# Patient Record
Sex: Male | Born: 1995 | Race: Black or African American | Hispanic: No | Marital: Single | Smoking: Never smoker
Health system: Southern US, Community
[De-identification: ages and names within clinical notes are randomized; demographics above are authoritative.]

## PROBLEM LIST (undated history)

## (undated) DIAGNOSIS — B2790 Infectious mononucleosis, unspecified without complication: Secondary | ICD-10-CM

## (undated) DIAGNOSIS — R635 Abnormal weight gain: Secondary | ICD-10-CM

## (undated) DIAGNOSIS — L709 Acne, unspecified: Secondary | ICD-10-CM

## (undated) DIAGNOSIS — E559 Vitamin D deficiency, unspecified: Secondary | ICD-10-CM

## (undated) HISTORY — DX: Vitamin D deficiency, unspecified: E55.9

## (undated) HISTORY — PX: WISDOM TOOTH EXTRACTION: SHX21

## (undated) HISTORY — DX: Infectious mononucleosis, unspecified without complication: B27.90

## (undated) HISTORY — DX: Abnormal weight gain: R63.5

## (undated) HISTORY — DX: Acne, unspecified: L70.9

---
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
## 2017-03-16 ENCOUNTER — Emergency Department (HOSPITAL_BASED_OUTPATIENT_CLINIC_OR_DEPARTMENT_OTHER)
Admission: EM | Admit: 2017-03-16 | Discharge: 2017-03-16 | Disposition: A | Discharge status: Home / Self Care | Payer: Medicaid Other | Attending: Emergency Medicine | Admitting: Emergency Medicine

## 2017-03-16 ENCOUNTER — Other Ambulatory Visit: Payer: Self-pay

## 2017-03-16 ENCOUNTER — Encounter (HOSPITAL_BASED_OUTPATIENT_CLINIC_OR_DEPARTMENT_OTHER): Payer: Self-pay | Admitting: Emergency Medicine

## 2017-03-16 ENCOUNTER — Emergency Department (HOSPITAL_BASED_OUTPATIENT_CLINIC_OR_DEPARTMENT_OTHER): Payer: Medicaid Other

## 2017-03-16 DIAGNOSIS — J069 Acute upper respiratory infection, unspecified: Secondary | ICD-10-CM | POA: Insufficient documentation

## 2017-03-16 DIAGNOSIS — R05 Cough: Secondary | ICD-10-CM | POA: Diagnosis present

## 2017-03-16 DIAGNOSIS — R531 Weakness: Secondary | ICD-10-CM | POA: Insufficient documentation

## 2017-03-16 NOTE — ED Provider Notes (Signed)
[facility] EMERGENCY DEPARTMENT Provider Note   CSN: [id number] Arrival date & time: 03/16/17  1636     History   Chief Complaint Chief Complaint  Patient presents with  . Cough    HPI Daniel Mccarthy is a 21 y.o. male.  HPI Patient has had a cough over the last month.  Generalized weakness also.  Slight sputum production.  States it feels as if he is just congested.  No fevers.  No sick contacts.  No fevers or chills.  Slight dull chest pain at times but no pain now.  Does not smoke.  He is otherwise healthy.  History of allergies also. History reviewed. No pertinent past medical history.  There are no active problems to display for this patient.   Past Surgical History:  Procedure Laterality Date  . WISDOM TOOTH EXTRACTION         Home Medications    Prior to Admission medications   Not on File    Family History History reviewed. No pertinent family history.  Social History Social History   Tobacco Use  . Smoking status: Never Smoker  . Smokeless tobacco: Never Used  Substance Use Topics  . Alcohol use: No  . Drug use: No     Allergies   Patient has no known allergies.   Review of Systems Review of Systems  Constitutional: Negative for activity change and appetite change.  HENT: Positive for congestion.   Eyes: Negative for pain.  Respiratory: Positive for cough. Negative for chest tightness and shortness of breath.   Cardiovascular: Negative for chest pain and leg swelling.  Gastrointestinal: Negative for abdominal pain, diarrhea, nausea and vomiting.  Genitourinary: Negative for flank pain.  Musculoskeletal: Negative for back pain.  Skin: Negative for rash.  Neurological: Negative for weakness.  Psychiatric/Behavioral: Negative for behavioral problems.     Physical Exam Updated Vital Signs BP 121/67 (BP Location: Left Arm)   Pulse 63   Temp 98.4 F (36.9 C) (Oral)   Resp 18   Ht 5\' 10"  (1.778 m)   Wt 79.4 kg (175 lb)    SpO2 100%   BMI 25.11 kg/m   Physical Exam  Constitutional: He appears well-developed.  HENT:  Head: Atraumatic.  Eyes: EOM are normal.  Neck: Neck supple.  Cardiovascular: Normal rate.  Pulmonary/Chest: Effort normal. He has no wheezes.  Abdominal: There is no tenderness.  Musculoskeletal: He exhibits no edema.  Neurological: He is alert.  Skin: Capillary refill takes less than 2 seconds.  Psychiatric: He has a normal mood and affect.     ED Treatments / Results  Labs (all labs ordered are listed, but only abnormal results are displayed) Labs Reviewed - No data to display  EKG  EKG Interpretation None       Radiology Dg Chest 2 View  Result Date: 03/16/2017 CLINICAL DATA:  Cough and chest congestion for 1 month. EXAM: CHEST  2 VIEW COMPARISON:  None. FINDINGS: The heart size and mediastinal contours are within normal limits. Both lungs are clear. Mild thoracic dextroscoliosis noted. IMPRESSION: No active cardiopulmonary disease.  Mild scoliosis. Electronically Signed   By: Myles RosenthalJohn  Stahl M.D.   On: 03/16/2017 17:18    Procedures Procedures (including critical care time)  Medications Ordered in ED Medications - No data to display   Initial Impression / Assessment and Plan / ED Course  I have reviewed the triage vital signs and the nursing notes.  Pertinent labs & imaging results that were available  during my care of the patient were reviewed by me and considered in my medical decision making (see chart for details).     Patient with cough for the last month.  Benign exam.  Lungs are clear.  Normal x-ray.  Posterior pharynx normal.  Discharge home.  Final Clinical Impressions(s) / ED Diagnoses   Final diagnoses:  Upper respiratory tract infection, unspecified type    ED Discharge Orders    None       Benjiman CorePickering, Amadou Katzenstein, MD 03/16/17 2101

## 2017-03-16 NOTE — ED Triage Notes (Signed)
Patient states that he has had a cough x 1 month  - patient also reports that today he started to have generalized weakness to his bilateral legs

## 2017-03-16 NOTE — ED Notes (Signed)
ED Provider at bedside. 

## 2018-02-17 ENCOUNTER — Emergency Department (HOSPITAL_BASED_OUTPATIENT_CLINIC_OR_DEPARTMENT_OTHER)
Admission: EM | Admit: 2018-02-17 | Discharge: 2018-02-17 | Disposition: A | Discharge status: Home / Self Care | Payer: Medicaid Other | Attending: Emergency Medicine | Admitting: Emergency Medicine

## 2018-02-17 ENCOUNTER — Encounter (HOSPITAL_BASED_OUTPATIENT_CLINIC_OR_DEPARTMENT_OTHER): Payer: Self-pay | Admitting: *Deleted

## 2018-02-17 ENCOUNTER — Other Ambulatory Visit: Payer: Self-pay

## 2018-02-17 DIAGNOSIS — M546 Pain in thoracic spine: Secondary | ICD-10-CM | POA: Insufficient documentation

## 2018-02-17 DIAGNOSIS — R55 Syncope and collapse: Secondary | ICD-10-CM | POA: Insufficient documentation

## 2018-02-17 NOTE — Discharge Instructions (Signed)
I did not see anything concerning on your ekg.  Please call your family doc.  Return for repeat episode.

## 2018-02-17 NOTE — ED Provider Notes (Signed)
[facility] EMERGENCY DEPARTMENT Provider Note   CSN: [id number] Arrival date & time: 02/17/18  2130     History   Chief Complaint Chief Complaint  Patient presents with  . Syncope    HPI Daniel Mccarthy is a 22 y.o. male.  22 yo M with a chief complaint of a syncopal event.  This actually happened 4 days ago.  The patient was at a friend's birthday party he started to feel bad and walked away and then collapsed to the ground.  He woke up and felt better and drove himself home.  He was talking to his mom today and was mentioning that he had some back pain after he fell and then disclosed that he had had a syncopal event.  She then brought him immediately to the emergency department.  He denies chest pain or shortness of breath denies unilateral lower extremity edema.  He denies hemoptysis.  Has a history of syncopal events upon blood draw.  He is having some mild mid thoracic back pain that is worse in certain positions.  Denies other injury from the fall.  The history is provided by the patient and a parent.  Loss of Consciousness   This is a new problem. The current episode started more than 2 days ago. The problem occurs rarely. The problem has been resolved. He lost consciousness for a period of less than one minute. The problem is associated with normal activity. Pertinent negatives include abdominal pain, chest pain, confusion, congestion, fever, headaches, palpitations and vomiting. He has tried relaxation for the symptoms. The treatment provided significant relief. His past medical history does not include DM or seizures.    History reviewed. No pertinent past medical history.  There are no active problems to display for this patient.   Past Surgical History:  Procedure Laterality Date  . WISDOM TOOTH EXTRACTION          Home Medications    Prior to Admission medications   Not on File    Family History History reviewed. No pertinent family  history.  Social History Social History   Tobacco Use  . Smoking status: Never Smoker  . Smokeless tobacco: Never Used  Substance Use Topics  . Alcohol use: No  . Drug use: No     Allergies   Patient has no known allergies.   Review of Systems Review of Systems  Constitutional: Negative for chills and fever.  HENT: Negative for congestion and facial swelling.   Eyes: Negative for discharge and visual disturbance.  Respiratory: Negative for shortness of breath.   Cardiovascular: Positive for syncope. Negative for chest pain and palpitations.  Gastrointestinal: Negative for abdominal pain, diarrhea and vomiting.  Musculoskeletal: Negative for arthralgias and myalgias.  Skin: Negative for color change and rash.  Neurological: Positive for syncope. Negative for tremors and headaches.  Psychiatric/Behavioral: Negative for confusion and dysphoric mood.     Physical Exam Updated Vital Signs BP 128/79 (BP Location: Right Arm)   Pulse 80   Temp 98 F (36.7 C) (Oral)   Resp 16   Ht 5\' 10"  (1.778 m)   SpO2 99%   BMI 25.11 kg/m   Physical Exam  Constitutional: He is oriented to person, place, and time. He appears well-developed and well-nourished.  HENT:  Head: Normocephalic and atraumatic.  Eyes: Pupils are equal, round, and reactive to light. EOM are normal.  Neck: Normal range of motion. Neck supple. No JVD present.  Cardiovascular: Normal rate and regular rhythm.  Exam reveals no gallop and no friction rub.  No murmur heard. Pulmonary/Chest: Breath sounds normal. No respiratory distress. He has no wheezes.  Abdominal: He exhibits no distension and no mass. There is no tenderness. There is no rebound and no guarding.  Musculoskeletal: Normal range of motion. He exhibits no edema.  Neurological: He is alert and oriented to person, place, and time.  Skin: No rash noted. No pallor.  Psychiatric: He has a normal mood and affect. His behavior is normal.  Nursing note and  vitals reviewed.    ED Treatments / Results  Labs (all labs ordered are listed, but only abnormal results are displayed) Labs Reviewed - No data to display  EKG EKG Interpretation  Date/Time:  Tuesday February 17 2018 09:33:34 EST Ventricular Rate:  64 PR Interval:    QRS Duration: 94 QT Interval:  400 QTC Calculation: 413 R Axis:   98 Text Interpretation:  Sinus rhythm Atrial premature complex Consider right ventricular hypertrophy LVH by voltage ST elev, probable normal early repol pattern no wpw, brugada prolonged qt No old tracing to compare Confirmed by Melene Plan [phone]) on 02/17/2018 9:43:53 AM   Radiology No results found.  Procedures Procedures (including critical care time)  Medications Ordered in ED Medications - No data to display   Initial Impression / Assessment and Plan / ED Course  I have reviewed the triage vital signs and the nursing notes.  Pertinent labs & imaging results that were available during my care of the patient were reviewed by me and considered in my medical decision making (see chart for details).     22 yo M with a chief complaint of a syncopal event.  Sounds vasovagal by history.  This actually happened 4 days ago.  ECG without concerning findings.  PCP follow-up.  9:55 AM:  I have discussed the diagnosis/risks/treatment options with the patient and family and believe the pt to be eligible for discharge home to follow-up with PCP. We also discussed returning to the ED immediately if new or worsening sx occur. We discussed the sx which are most concerning (e.g., sudden worsening pain, fever, inability to tolerate by mouth) that necessitate immediate return. Medications administered to the patient during their visit and any new prescriptions provided to the patient are listed below.  Medications given during this visit Medications - No data to display    The patient appears reasonably screen and/or stabilized for discharge and I doubt any  other medical condition or other [facility] requiring further screening, evaluation, or treatment in the ED at this time prior to discharge.    Final Clinical Impressions(s) / ED Diagnoses   Final diagnoses:  Vasovagal syncope  Acute bilateral thoracic back pain    ED Discharge Orders    None       Melene Plan, DO 02/17/18 2536

## 2018-02-17 NOTE — ED Triage Notes (Addendum)
Passed out last Saturday at friend's house during a party.

## 2018-03-05 ENCOUNTER — Telehealth: Payer: Self-pay

## 2018-03-05 NOTE — Telephone Encounter (Signed)
Sent referral to scheduling and filed notes 

## 2018-05-13 ENCOUNTER — Encounter: Payer: Self-pay | Admitting: Interventional Cardiology

## 2018-05-14 ENCOUNTER — Ambulatory Visit: Payer: Medicaid Other | Admitting: Interventional Cardiology

## 2018-05-14 ENCOUNTER — Encounter: Payer: Self-pay | Admitting: Interventional Cardiology

## 2018-05-14 VITALS — BP 110/82 | HR 72 | Ht 70.0 in | Wt 159.8 lb

## 2018-05-14 DIAGNOSIS — R55 Syncope and collapse: Secondary | ICD-10-CM

## 2018-05-14 NOTE — Progress Notes (Signed)
Cardiology Office Note   Date:  05/14/2018   ID:  Daniel Meadatrick A Caniglia, DOB Mar 08, 1996, MRN [id number]  PCP:  [facility]    No chief complaint on file.  syncope  Wt Readings from Last 3 Encounters:  05/14/18 159 lb 12.8 oz (72.5 kg)  03/16/17 175 lb (79.4 kg) (77 %, Z= 0.74)*  12/20/16 175 lb (79.4 kg) (78 %, Z= 0.77)*   * Growth percentiles are based on CDC (Boys, 2-20 Years) data.       History of Present Illness: Daniel Mccarthy is a 23 y.o. male who is being seen today for the evaluation of syncope at the request of White, Sonja F, NP.  In November 2019, he was going to his friends party.  He got there and felt some SHOB, and then suddenly passed out.  He quickly regained consciousnes.  He was given water and then he relaxed.  He felt better and then drove home and only told his Mom a few days later.  He went to the hospital Since then, he feels some fatigue.  No further syncope since 11/19.    Denies : Chest pain. Dizziness. Leg edema. Nitroglycerin use. Orthopnea. Palpitations. Paroxysmal nocturnal dyspnea. Shortness of breath.   He exercises on occasion.  No problems with activity.  He changes tires at work.  He has some back pain, but no other issues with dizziness when working.  No prior cardiac testing other than ECG.    No syncope associated with activity.      Past Medical History:  Diagnosis Date  . Acne   . Infectious mononucleosis   . Rapid weight gain   . Vitamin D deficiency, unspecified     Past Surgical History:  Procedure Laterality Date  . WISDOM TOOTH EXTRACTION       Current Outpatient Medications  Medication Sig Dispense Refill  . amoxicillin-clavulanate (AUGMENTIN) 875-125 MG tablet Take 1 tablet by mouth 2 (two) times daily.    . cetirizine HCl (ZYRTEC) 5 MG/5ML SOLN Take 10 mLs by mouth daily.    . Cholecalciferol (VITAMIN D3) 50 MCG (2000 UT) TABS Take 1 tablet by mouth daily.    . cyclobenzaprine  (FLEXERIL) 5 MG tablet Take 5 mg by mouth 3 (three) times daily.     No current facility-administered medications for this visit.     Allergies:   Patient has no known allergies.    Social History:  The patient  reports that he has never smoked. He has never used smokeless tobacco. He reports that he does not drink alcohol or use drugs.   Family History:  The patient's family history includes Anemia in an other family member; Cancer in an other family member; Kidney disease in an other family member; Other in an other family member.    ROS:  Please see the history of present illness.   Otherwise, review of systems are positive for syncope in 11/19.   All other systems are reviewed and negative.    PHYSICAL EXAM: VS:  BP 110/82   Pulse 72   Ht 5\' 10"  (1.778 m)   Wt 159 lb 12.8 oz (72.5 kg)   SpO2 99%   BMI 22.93 kg/m  , BMI Body mass index is 22.93 kg/m. GEN: Well nourished, well developed, in no acute distress  HEENT: normal  Neck: no JVD, carotid bruits, or masses Cardiac: RRR; no murmurs, rubs, or gallops,no edema ; no murmurs when going from squatting  to standing Respiratory:  clear to auscultation bilaterally, normal work of breathing GI: soft, nontender, nondistended, + BS MS: no deformity or atrophy  Skin: warm and dry, no rash Neuro:  Strength and sensation are intact Psych: euthymic mood, full affect   EKG:   The ekg ordered in 11/19 demonstrates NSR, early repol,normal intervals   Recent Labs: No results found for requested labs within last 8760 hours.   Lipid Panel No results found for: CHOL, TRIG, HDL, CHOLHDL, VLDL, LDLCALC, LDLDIRECT   Other studies Reviewed: Additional studies/ records that were reviewed today with results demonstrating: prior ECG reviewed.   ASSESSMENT AND PLAN:  1. Syncope: One episode.  No further episodes in over 2 months.  Stay hydrated.  Normal cardiac exam.  No plan for cardiac testing.  I think echo or rhythm monitor would be  low yield at this time.    2. He admits to not drinking much water.  I encouraged him to try to stay hydrated with water.  He should avoid caffeinated beverages.  He does not drink alcohol.  He does not use any other illicit drugs. 3. *He has exercised in the past.  He should get some routine exercise but pay attention to staying hydrated. 4. If he has recurrent syncope or lightheadedness, he will let us know.  We could reconsider some type of cardiac imaging and /or rhythm monitoring.   Current medicines are reviewed at length with the patient today.  The patient concerns regarding his medicines were addressed.  The following changes have been made:  No change  Labs/ tests ordered today include:  No orders of the defined types were placed in this encounter.   Recommend 150 minutes/week of aerobic exercise Low fat, low carb, high fiber diet recommended  Disposition:   FU as needed   Signed, Lance Muss, MD  05/14/2018 11:48 AM    [facility] [address] [place], [place], [place]  [address] Phone: [phone]; Fax: [phone]

## 2018-05-14 NOTE — Patient Instructions (Signed)
Medication Instructions:  Your physician recommends that you continue on your current medications as directed. Please refer to the Current Medication list given to you today.  If you need a refill on your cardiac medications before your next appointment, please call your pharmacy.   Lab work: None Ordered  If you have labs (blood work) drawn today and your tests are completely normal, you will receive your results only by: . MyChart Message (if you have MyChart) OR . A paper copy in the mail If you have any lab test that is abnormal or we need to change your treatment, we will call you to review the results.  Testing/Procedures: None ordered  Follow-Up: . AS NEEDED  Any Other Special Instructions Will Be Listed Below (If Applicable).    

## 2019-01-30 IMAGING — DX DG CHEST 2V
2 series · 2 of 2 positions shown · non-contrast
Comparison: None.

CLINICAL DATA: Cough and chest congestion for 1 month.

EXAM:
CHEST  2 VIEW

[chest pa]
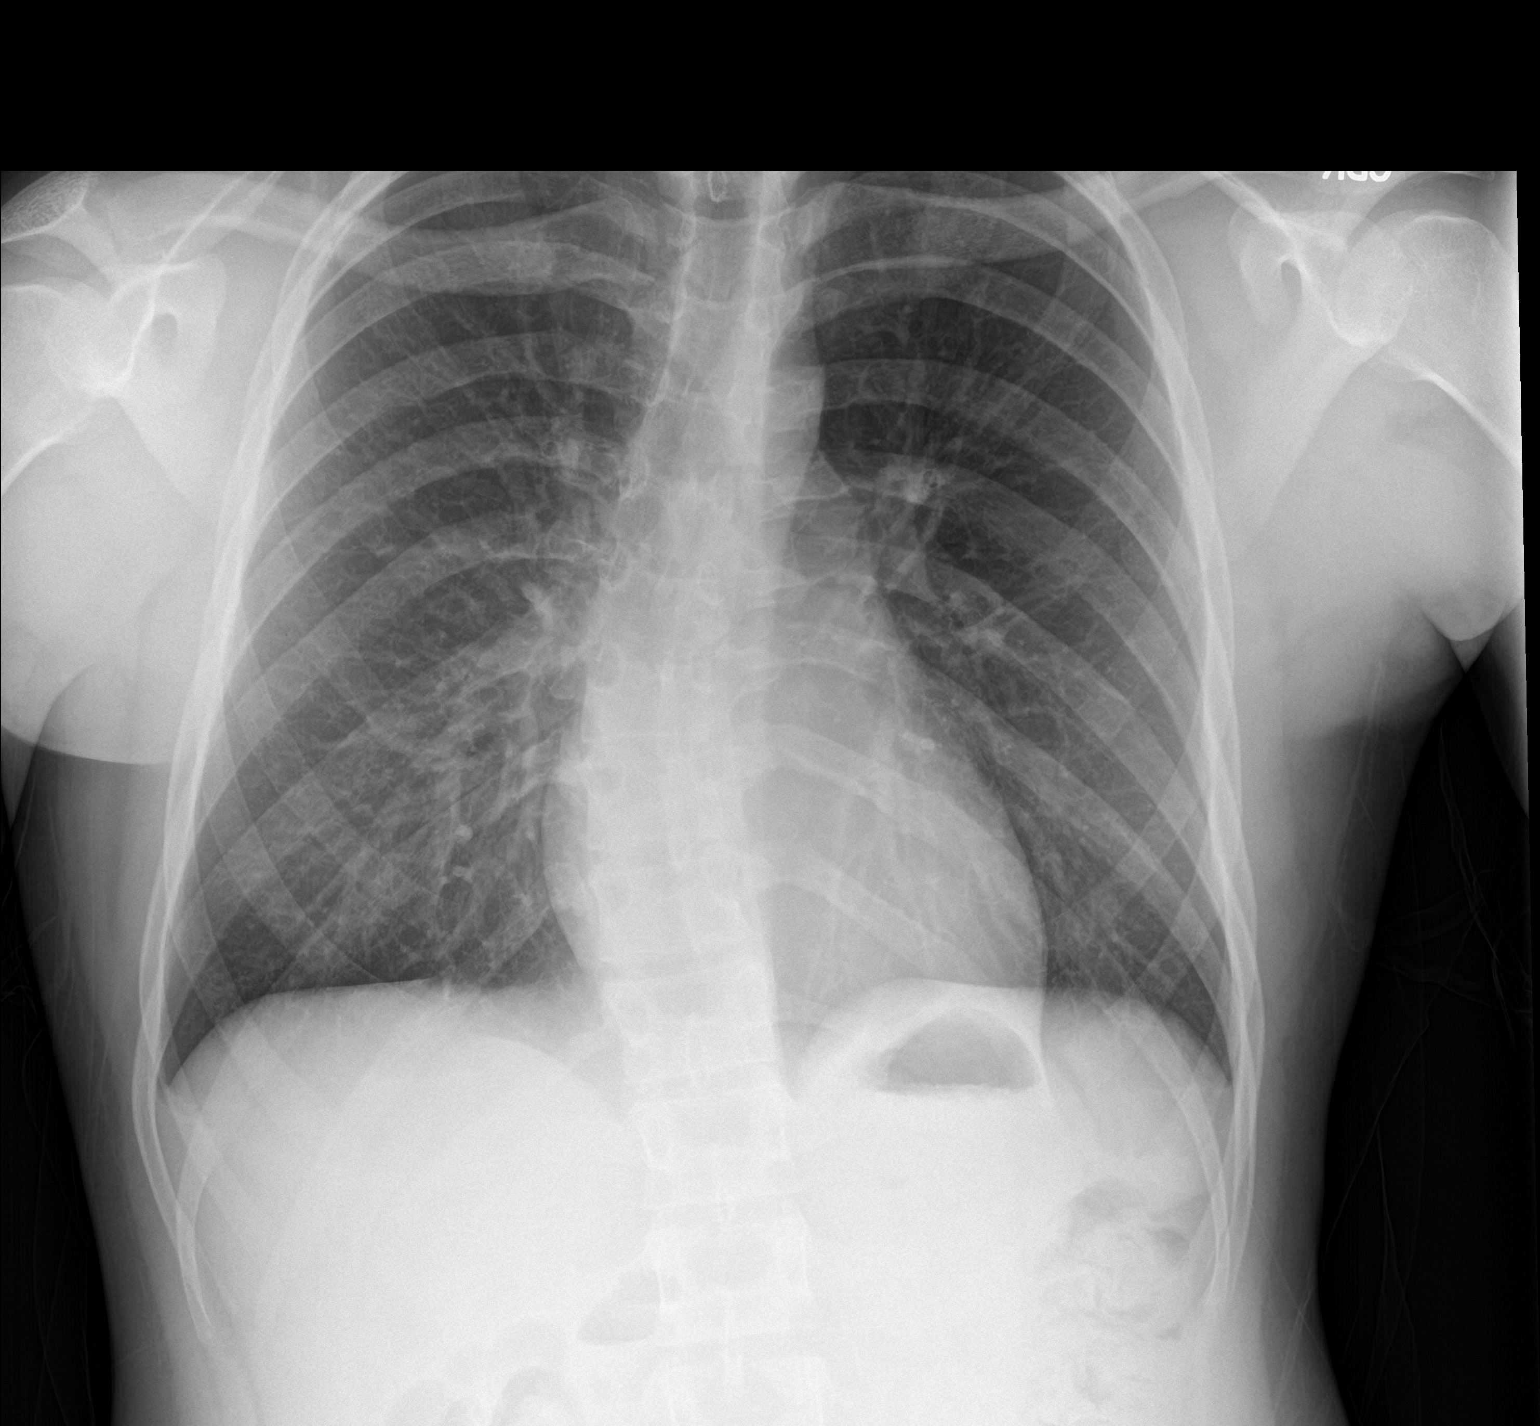

[chest lat]
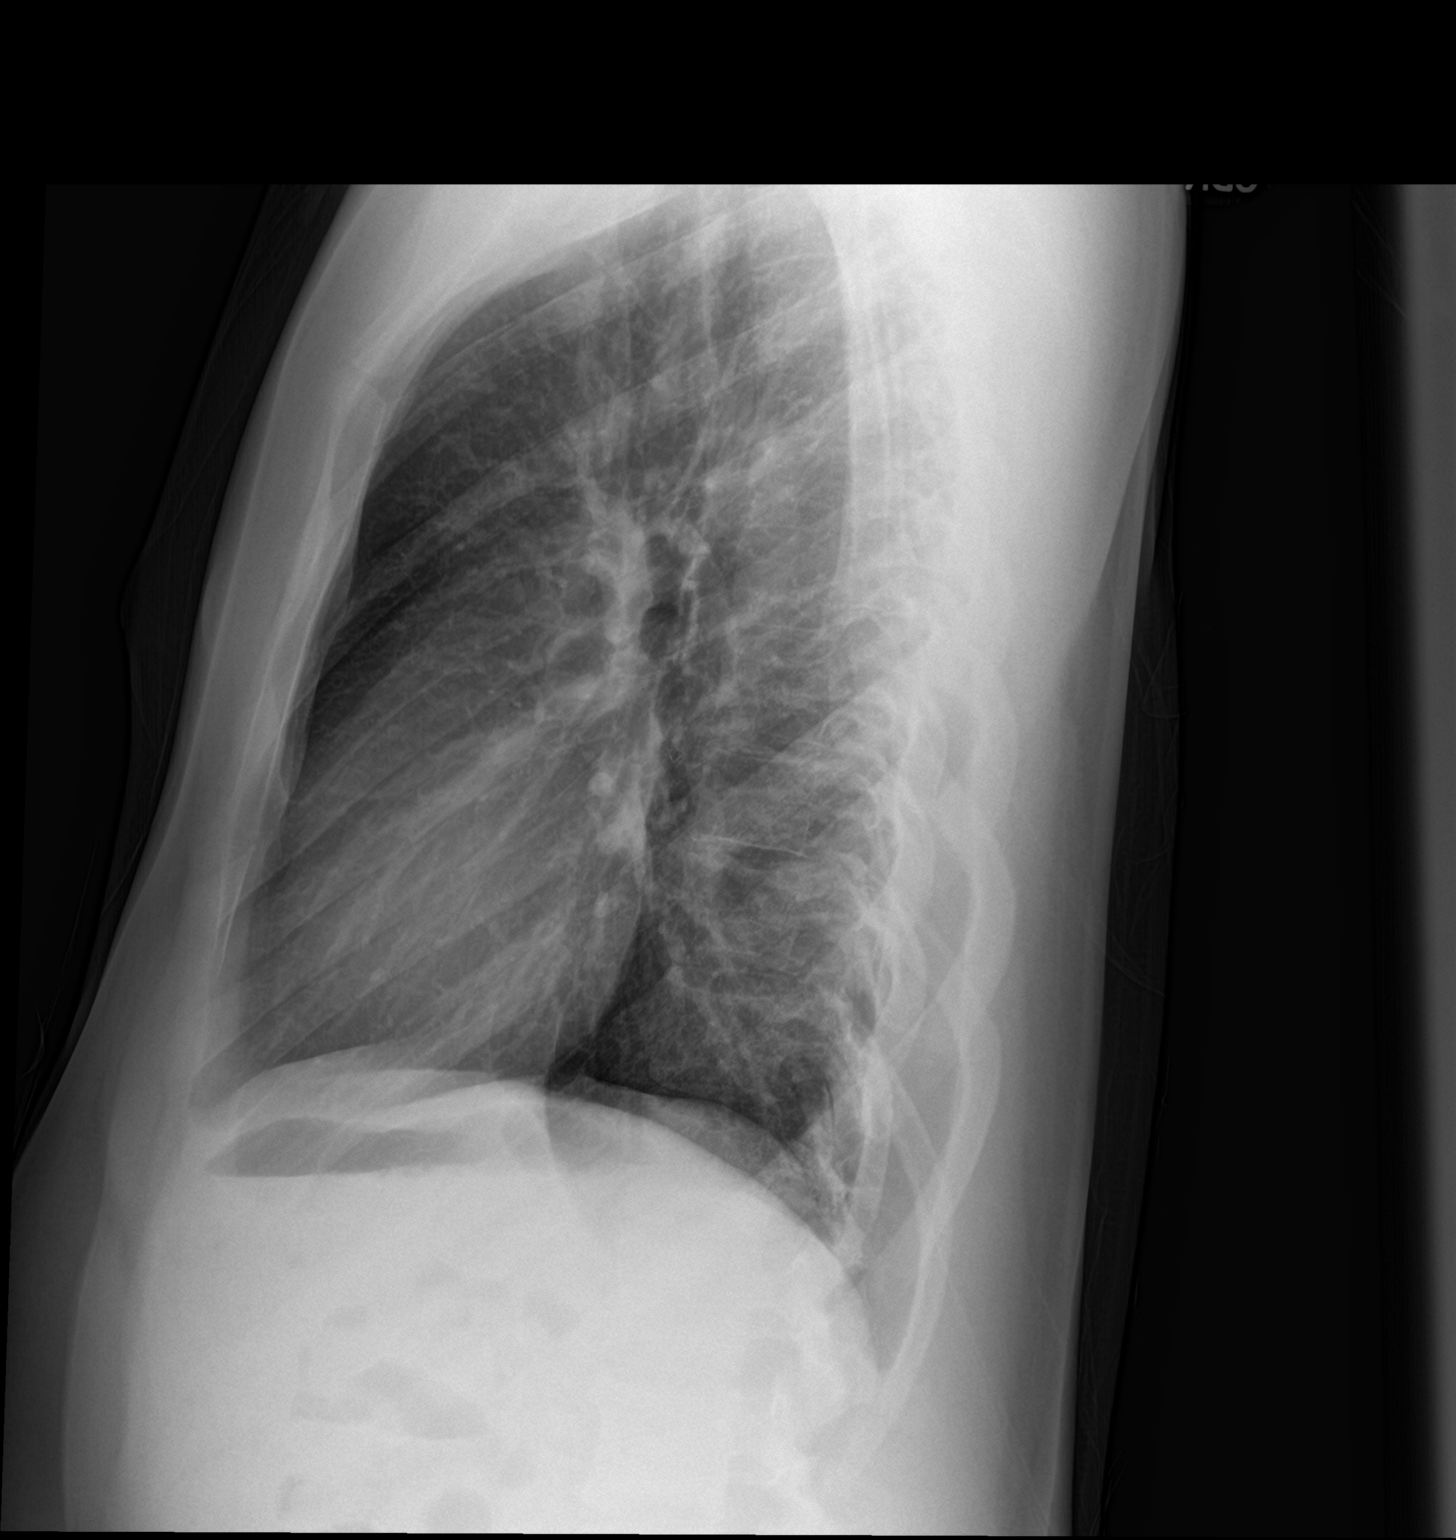

[2 of 2 positions shown; findings below may reference images not displayed]

FINDINGS: The heart size and mediastinal contours are within normal limits.
Both lungs are clear. Mild thoracic dextroscoliosis noted.
IMPRESSION: No active cardiopulmonary disease.  Mild scoliosis.

## 2019-04-21 ENCOUNTER — Emergency Department (HOSPITAL_COMMUNITY)
Admission: EM | Admit: 2019-04-21 | Discharge: 2019-04-21 | Disposition: A | Discharge status: Home / Self Care | Payer: Medicaid Other | Attending: Emergency Medicine | Admitting: Emergency Medicine

## 2019-04-21 ENCOUNTER — Other Ambulatory Visit: Payer: Self-pay

## 2019-04-21 ENCOUNTER — Encounter (HOSPITAL_COMMUNITY): Payer: Self-pay | Admitting: *Deleted

## 2019-04-21 DIAGNOSIS — Z20822 Contact with and (suspected) exposure to covid-19: Secondary | ICD-10-CM | POA: Insufficient documentation

## 2019-04-21 DIAGNOSIS — Z5321 Procedure and treatment not carried out due to patient leaving prior to being seen by health care provider: Secondary | ICD-10-CM | POA: Insufficient documentation

## 2019-04-21 NOTE — ED Triage Notes (Signed)
To ED for eval after being exposed to covid by sister who tested positive. No symptoms.

## 2019-07-27 ENCOUNTER — Ambulatory Visit: Payer: Self-pay | Admitting: Primary Care

## 2019-07-27 DIAGNOSIS — Z0289 Encounter for other administrative examinations: Secondary | ICD-10-CM

## 2019-10-11 ENCOUNTER — Other Ambulatory Visit: Payer: Self-pay

## 2019-10-11 ENCOUNTER — Emergency Department (HOSPITAL_COMMUNITY)
Admission: EM | Admit: 2019-10-11 | Discharge: 2019-10-11 | Disposition: A | Discharge status: Home / Self Care | Payer: No Typology Code available for payment source | Attending: Emergency Medicine | Admitting: Emergency Medicine

## 2019-10-11 ENCOUNTER — Encounter (HOSPITAL_COMMUNITY): Payer: Self-pay | Admitting: Obstetrics and Gynecology

## 2019-10-11 DIAGNOSIS — Y9289 Other specified places as the place of occurrence of the external cause: Secondary | ICD-10-CM | POA: Insufficient documentation

## 2019-10-11 DIAGNOSIS — Y999 Unspecified external cause status: Secondary | ICD-10-CM | POA: Diagnosis not present

## 2019-10-11 DIAGNOSIS — Y9389 Activity, other specified: Secondary | ICD-10-CM | POA: Insufficient documentation

## 2019-10-11 DIAGNOSIS — M542 Cervicalgia: Secondary | ICD-10-CM | POA: Insufficient documentation

## 2019-10-11 DIAGNOSIS — M436 Torticollis: Secondary | ICD-10-CM | POA: Diagnosis not present

## 2019-10-11 MED ORDER — NAPROXEN 375 MG PO TABS: 375.0000 mg | ORAL_TABLET | Freq: Two times a day (BID) | ORAL | 0 refills | Status: DC

## 2019-10-11 MED ORDER — METHOCARBAMOL 500 MG PO TABS: 500.0000 mg | ORAL_TABLET | Freq: Two times a day (BID) | ORAL | 0 refills | Status: DC
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
## 2019-10-11 NOTE — Discharge Instructions (Signed)

## 2020-04-18 ENCOUNTER — Other Ambulatory Visit: Payer: Self-pay

## 2020-04-18 ENCOUNTER — Emergency Department (HOSPITAL_BASED_OUTPATIENT_CLINIC_OR_DEPARTMENT_OTHER)
Admission: EM | Admit: 2020-04-18 | Discharge: 2020-04-18 | Disposition: A | Discharge status: Home / Self Care | Payer: Federal, State, Local not specified - PPO | Attending: Emergency Medicine | Admitting: Emergency Medicine

## 2020-04-18 ENCOUNTER — Encounter (HOSPITAL_BASED_OUTPATIENT_CLINIC_OR_DEPARTMENT_OTHER): Payer: Self-pay | Admitting: Emergency Medicine

## 2020-04-18 DIAGNOSIS — B9789 Other viral agents as the cause of diseases classified elsewhere: Secondary | ICD-10-CM | POA: Diagnosis not present

## 2020-04-18 DIAGNOSIS — U071 COVID-19: Secondary | ICD-10-CM | POA: Diagnosis not present

## 2020-04-18 DIAGNOSIS — J069 Acute upper respiratory infection, unspecified: Secondary | ICD-10-CM | POA: Insufficient documentation

## 2020-04-18 DIAGNOSIS — Z20822 Contact with and (suspected) exposure to covid-19: Secondary | ICD-10-CM

## 2020-04-18 DIAGNOSIS — R509 Fever, unspecified: Secondary | ICD-10-CM | POA: Diagnosis not present

## 2020-04-18 LAB — RESP PANEL BY RT-PCR (FLU A&B, COVID) ARPGX2
Influenza A by PCR: NEGATIVE
Influenza B by PCR: NEGATIVE
SARS Coronavirus 2 by RT PCR: POSITIVE — AB

## 2020-04-18 NOTE — ED Notes (Signed)
Called x 1 in [facility], no answer 

## 2020-04-18 NOTE — ED Notes (Signed)
Pt denies covid or flu vaccines. States was feeling feverish with body aches and headache this morning. Denies N/V/D

## 2020-04-18 NOTE — ED Provider Notes (Signed)
[facility] EMERGENCY DEPARTMENT Provider Note   CSN: [id number] Arrival date & time: 04/18/20  1208     History Chief Complaint  Patient presents with  . Fever    Daniel Mccarthy is a 25 y.o. male reports is otherwise healthy no daily medication use.  Patient reports this morning he developed headache, body aches, rhinorrhea and nasal congestion.  He reports that he has no known sick contacts but is unvaccinated against Covid and flu.  He reports headache and body aches have completely resolved following ibuprofen this morning.  He reports that his rhinorrhea and nasal congestion have continued.  He reported his headache was a mild aching sensation generalized nonradiating no aggravating factors  Reports feeling warm but did not measure fever at home.  Denies vision changes, neck stiffness, sore throat, chest pain/shortness of breath, cough, hemoptysis, abdominal pain, nausea/vomiting, diarrhea, extremity swelling/color change or any additional concerns.  HPI     Past Medical History:  Diagnosis Date  . Acne   . Infectious mononucleosis   . Rapid weight gain   . Vitamin D deficiency, unspecified     There are no problems to display for this patient.   Past Surgical History:  Procedure Laterality Date  . WISDOM TOOTH EXTRACTION         Family History  Problem Relation Age of Onset  . Anemia Other   . Cancer Other   . Other Other        CARDIOVASCULAR DISEASE  . Kidney disease Other     Social History   Tobacco Use  . Smoking status: Never Smoker  . Smokeless tobacco: Never Used  Vaping Use  . Vaping Use: Never used  Substance Use Topics  . Alcohol use: Yes    Comment: occ  . Drug use: No    Home Medications Prior to Admission medications   Medication Sig Start Date End Date Taking? Authorizing Provider  amoxicillin-clavulanate (AUGMENTIN) 875-125 MG tablet Take 1 tablet by mouth 2 (two) times daily.    [provider]  cetirizine  HCl (ZYRTEC) 5 MG/5ML SOLN Take 10 mLs by mouth daily.    [provider]  Cholecalciferol (VITAMIN D3) 50 MCG (2000 UT) TABS Take 1 tablet by mouth daily.    [provider]  cyclobenzaprine (FLEXERIL) 5 MG tablet Take 5 mg by mouth 3 (three) times daily. 02/26/18   [provider]  methocarbamol (ROBAXIN) 500 MG tablet Take 1 tablet (500 mg total) by mouth 2 (two) times daily. 10/11/19   Harris, Vernie Shanks, PA-C  naproxen (NAPROSYN) 375 MG tablet Take 1 tablet (375 mg total) by mouth 2 (two) times daily. 10/11/19   Margarita Mail, PA-C    Allergies    Patient has no known allergies.  Review of Systems   Review of Systems  Constitutional: Positive for fever. Negative for chills.  HENT: Positive for rhinorrhea. Negative for sore throat and voice change.   Eyes: Negative.  Negative for visual disturbance.  Respiratory: Negative.  Negative for cough and shortness of breath.   Cardiovascular: Negative.  Negative for chest pain.  Gastrointestinal: Negative.  Negative for abdominal pain, diarrhea, nausea and vomiting.  Genitourinary: Negative.  Negative for dysuria and hematuria.  Musculoskeletal: Positive for arthralgias and myalgias. Negative for neck pain.  Neurological: Positive for headaches. Negative for weakness.    Physical Exam Updated Vital Signs BP 112/73 (BP Location: Right Arm)   Pulse 87   Temp 98.6 F (37 C) (Oral)  Resp 16   Ht 5\' 11"  (1.803 m)   Wt 71.8 kg   SpO2 100%   BMI 22.08 kg/m   Physical Exam Constitutional:      General: He is not in acute distress.    Appearance: Normal appearance. He is well-developed. He is not ill-appearing or diaphoretic.  HENT:     Head: Normocephalic and atraumatic.     Jaw: There is normal jaw occlusion. No trismus.     Right Ear: External ear normal.     Left Ear: External ear normal.     Nose: Rhinorrhea present. Rhinorrhea is clear.     Right Sinus: No maxillary sinus tenderness or frontal sinus  tenderness.     Left Sinus: No maxillary sinus tenderness or frontal sinus tenderness.     Mouth/Throat:     Mouth: Mucous membranes are moist.     Pharynx: Oropharynx is clear. Uvula midline.  Eyes:     General: Vision grossly intact. Gaze aligned appropriately.     Extraocular Movements: Extraocular movements intact.     Conjunctiva/sclera: Conjunctivae normal.     Pupils: Pupils are equal, round, and reactive to light.  Neck:     Trachea: Trachea and phonation normal. No tracheal tenderness or tracheal deviation.     Meningeal: Brudzinski's sign absent.  Cardiovascular:     Rate and Rhythm: Normal rate and regular rhythm.     Pulses:          Dorsalis pedis pulses are 2+ on the right side and 2+ on the left side.  Pulmonary:     Effort: Pulmonary effort is normal. No accessory muscle usage or respiratory distress.     Breath sounds: Normal breath sounds and air entry.  Abdominal:     General: There is no distension.     Palpations: Abdomen is soft.     Tenderness: There is no abdominal tenderness. There is no guarding or rebound.  Musculoskeletal:        General: Normal range of motion.     Cervical back: Normal range of motion and neck supple.  Skin:    General: Skin is warm and dry.  Neurological:     Mental Status: He is alert.     GCS: GCS eye subscore is 4. GCS verbal subscore is 5. GCS motor subscore is 6.     Comments: Speech is clear and goal oriented, follows commands Major Cranial nerves without deficit, no facial droop Normal strength in upper and lower extremities bilaterally including dorsiflexion and plantar flexion, strong and equal grip strength Sensation normal to light and sharp touch Moves extremities without ataxia, coordination intact Normal finger to nose and rapid alternating movements Neg romberg, no pronator drift Normal gait Normal heel-shin and balance  Psychiatric:        Behavior: Behavior normal.    ED Results / Procedures / Treatments    Labs (all labs ordered are listed, but only abnormal results are displayed) Labs Reviewed  RAPID INFLUENZA A&B ANTIGENS  SARS CORONAVIRUS 2 (TAT 6-24 HRS)    EKG None  Radiology No results found.  Procedures Procedures (including critical care time)  Medications Ordered in ED Medications - No data to display  ED Course  I have reviewed the triage vital signs and the nursing notes.  Pertinent labs & imaging results that were available during my care of the patient were reviewed by me and considered in my medical decision making (see chart for details).    MDM  Rules/Calculators/A&P                         Additional history obtained from: 1. Nursing notes from this visit. ========= 25 year old otherwise healthy male presented with viral illness x1 day.  He is experiencing rhinorrhea, congestion, body aches and headache.  His body aches and headaches are completely resolved following ibuprofen earlier this morning.  He is unvaccinated against Covid and flu and would like testing today.  On exam he is well-appearing no acute distress, cranial nerves intact, no meningeal signs, airway clear.  No evidence of sinusitis, PTA, RPA, Ludewig's, tonsillitis or other deep space infections of the head or neck.  Cardiopulmonary exam is unremarkable.  Abdomen soft nontender.  Neurovascular tact to all 4 extremities without evidence of DVT.  Normal neurologic exam.  Vital signs are stable without fever, hypoxia, tachycardia, tachypnea or hypotension.  There is no indication for blood work or imaging.  Will obtain Covid test and flu test and allow patient to go home.  He is requesting discharge as soon as possible.  I encourage patient to follow-up on his MyChart account regarding his results and discuss them with his PCP at follow-up visit this week.  Encourage water intake, OTC anti-inflammatories and rest.  Isolation/quarantine precautions discussed.  At this time there does not appear to be any  evidence of an acute emergency medical condition and the patient appears stable for discharge with appropriate outpatient follow up. Diagnosis was discussed with patient who verbalizes understanding of care plan and is agreeable to discharge. I have discussed return precautions with patient who verbalizes understanding. Patient encouraged to follow-up with their PCP. All questions answered.  Daniel Mccarthy was evaluated in Emergency Department on 04/18/2020 for the symptoms described in the history of present illness. He was evaluated in the context of the global COVID-19 pandemic, which necessitated consideration that the patient might be at risk for infection with the SARS-CoV-2 virus that causes COVID-19. Institutional protocols and algorithms that pertain to the evaluation of patients at risk for COVID-19 are in a state of rapid change based on information released by regulatory bodies including the CDC and federal and state organizations. These policies and algorithms were followed during the patient's care in the ED.  Note: Portions of this report may have been transcribed using voice recognition software. Every effort was made to ensure accuracy; however, inadvertent computerized transcription errors may still be present. Final Clinical Impression(s) / ED Diagnoses Final diagnoses:  Viral URI  Encounter for laboratory testing for COVID-19 virus    Rx / DC Orders ED Discharge Orders    None       Elizabeth Palau 04/18/20 1858    Milagros Loll, MD 04/25/20 [phone]

## 2020-04-18 NOTE — Discharge Instructions (Addendum)
At this time there does not appear to be the presence of an emergent medical condition, however there is always the potential for conditions to change. Please read and follow the below instructions.  Please return to the Emergency Department immediately for any new or worsening symptoms. Please be sure to follow up with your Primary Care Provider within one week regarding your visit today; please call their office to schedule an appointment even if you are feeling better for a follow-up visit. Your Covid test and influenza tests are currently in process they should result in the next 1 day.  Please check your MyChart account for those results and discuss them with your primary care provider as soon as they are available.  Please drink plenty of water and get plenty of rest.  You may use over-the-counter anti-inflammatory such as Tylenol as directed on the packaging to help with your symptoms.  Go to the nearest Emergency Department immediately if: You have fever or chills You feel pain or pressure in your chest. You have abdominal pain You have vomiting and cannot keep down food or water if your Covid test is positive you will need to quarantine for 10 days.   You have shortness of breath. You faint or feel like you will faint. You keep throwing up (vomiting). You feel confused. You have any new/concerning or worsening symptoms.   Please read the additional information packets attached to your discharge summary.  Do not take your medicine if  develop an itchy rash, swelling in your mouth or lips, or difficulty breathing; call 911 and seek immediate emergency medical attention if this occurs.  You may review your lab tests and imaging results in their entirety on your MyChart account.  Please discuss all results of fully with your primary care provider and other specialist at your follow-up visit.  Note: Portions of this text may have been transcribed using voice recognition software. Every  effort was made to ensure accuracy; however, inadvertent computerized transcription errors may still be present.

## 2020-04-18 NOTE — ED Notes (Signed)
ONLY COVID SWAB WAS OBTAINED

## 2020-04-18 NOTE — ED Triage Notes (Signed)
Pt arrives pov with c/o HA, fever chills and generalized body aches that pt reports as starting when waking this morning. Endorses taking ibuprofen at 0600 today with relief.

## 2020-04-18 NOTE — ED Notes (Signed)
Covid Swab obtained and to the lab 

## 2021-03-10 ENCOUNTER — Encounter (HOSPITAL_COMMUNITY): Payer: Self-pay | Admitting: Emergency Medicine

## 2021-03-10 ENCOUNTER — Emergency Department (HOSPITAL_COMMUNITY)
Admission: EM | Admit: 2021-03-10 | Discharge: 2021-03-10 | Disposition: A | Discharge status: Home / Self Care | Payer: Federal, State, Local not specified - PPO | Attending: Emergency Medicine | Admitting: Emergency Medicine

## 2021-03-10 ENCOUNTER — Other Ambulatory Visit: Payer: Self-pay

## 2021-03-10 DIAGNOSIS — Z20822 Contact with and (suspected) exposure to covid-19: Secondary | ICD-10-CM | POA: Diagnosis not present

## 2021-03-10 DIAGNOSIS — J029 Acute pharyngitis, unspecified: Secondary | ICD-10-CM | POA: Insufficient documentation

## 2021-03-10 LAB — RESP PANEL BY RT-PCR (FLU A&B, COVID) ARPGX2
Influenza A by PCR: NEGATIVE
Influenza B by PCR: NEGATIVE
SARS Coronavirus 2 by RT PCR: NEGATIVE

## 2021-03-10 LAB — GROUP A STREP BY PCR: Group A Strep by PCR: NOT DETECTED

## 2021-03-10 MED ORDER — ACETAMINOPHEN 500 MG PO TABS: 500.0000 mg | ORAL_TABLET | Freq: Four times a day (QID) | ORAL | 0 refills | Status: DC | PRN

## 2021-03-10 MED ORDER — LIDOCAINE VISCOUS HCL 2 % MT SOLN: 15.0000 mL | Freq: Four times a day (QID) | OROMUCOSAL | 0 refills | Status: DC | PRN

## 2021-03-10 NOTE — ED Triage Notes (Signed)
C/o sore throat x 3 days.

## 2021-03-10 NOTE — Discharge Instructions (Signed)
You have been evaluated for your symptoms.  Fortunately your strep test is negative.  You may follow-up on the results of your COVID and flu test through MyChart, link below.  Take medication prescribed as needed for symptom control

## 2021-03-10 NOTE — ED Provider Notes (Signed)
[facility] EMERGENCY DEPARTMENT Provider Note   CSN: [id number] Arrival date & time: 03/10/21  1443     History Chief Complaint  Patient presents with   Sore Throat    Daniel Mccarthy is a 25 y.o. male.  The history is provided by the patient. No language interpreter was used.  Sore Throat   25 year old male who presents complaining of sore throat.  Patient report for the past 3 days he has been having sore throat.  Described as increasing pain with talking and swallowing.  Rates as moderate in severity.  Denies having fever body aches runny nose sneezing coughing shortness of breath or ear pain.  He has been taking over-the-counter medication with some relief.  No recent sick contact.  Past Medical History:  Diagnosis Date   Acne    Infectious mononucleosis    Rapid weight gain    Vitamin D deficiency, unspecified     There are no problems to display for this patient.   Past Surgical History:  Procedure Laterality Date   WISDOM TOOTH EXTRACTION         Family History  Problem Relation Age of Onset   Anemia Other    Cancer Other    Other Other        CARDIOVASCULAR DISEASE   Kidney disease Other     Social History   Tobacco Use   Smoking status: Never   Smokeless tobacco: Never  Vaping Use   Vaping Use: Never used  Substance Use Topics   Alcohol use: Yes    Comment: occ   Drug use: No    Home Medications Prior to Admission medications   Medication Sig Start Date End Date Taking? Authorizing Provider  amoxicillin-clavulanate (AUGMENTIN) 875-125 MG tablet Take 1 tablet by mouth 2 (two) times daily.    [provider]  cetirizine HCl (ZYRTEC) 5 MG/5ML SOLN Take 10 mLs by mouth daily.    [provider]  Cholecalciferol (VITAMIN D3) 50 MCG (2000 UT) TABS Take 1 tablet by mouth daily.    [provider]  cyclobenzaprine (FLEXERIL) 5 MG tablet Take 5 mg by mouth 3 (three) times daily. 02/26/18   [provider]  methocarbamol (ROBAXIN) 500 MG tablet Take 1 tablet (500 mg total) by mouth 2 (two) times daily. 10/11/19   Harris, Cammy Copa, PA-C  naproxen (NAPROSYN) 375 MG tablet Take 1 tablet (375 mg total) by mouth 2 (two) times daily. 10/11/19   Arthor Captain, PA-C    Allergies    Patient has no known allergies.  Review of Systems   Review of Systems  Constitutional:  Negative for fever.  HENT:  Positive for sore throat. Negative for congestion.    Physical Exam Updated Vital Signs BP 129/79 (BP Location: Left Arm)   Pulse 66   Temp 98.6 F (37 C)   Resp 15   SpO2 99%   Physical Exam Vitals and nursing note reviewed.  Constitutional:      General: He is not in acute distress.    Appearance: He is well-developed.  HENT:     Head: Atraumatic.     Mouth/Throat:     Mouth: Mucous membranes are moist. No oral lesions.     Pharynx: Uvula midline. Posterior oropharyngeal erythema present. No pharyngeal swelling, oropharyngeal exudate or uvula swelling.  Eyes:     Conjunctiva/sclera: Conjunctivae normal.  Cardiovascular:     Rate and Rhythm: Normal rate and regular rhythm.  Pulmonary:  Effort: Pulmonary effort is normal.     Breath sounds: Normal breath sounds.  Musculoskeletal:     Cervical back: Neck supple.  Skin:    Findings: No rash.  Neurological:     Mental Status: He is alert.    ED Results / Procedures / Treatments   Labs (all labs ordered are listed, but only abnormal results are displayed) Labs Reviewed  RESP PANEL BY RT-PCR (FLU A&B, COVID) ARPGX2  GROUP A STREP BY PCR    EKG None  Radiology No results found.  Procedures Procedures   Medications Ordered in ED Medications - No data to display  ED Course  I have reviewed the triage vital signs and the nursing notes.  Pertinent labs & imaging results that were available during my care of the patient were reviewed by me and considered in my medical decision making (see chart for  details).    MDM Rules/Calculators/A&P                           BP 129/79 (BP Location: Left Arm)   Pulse 66   Temp 98.6 F (37 C)   Resp 15   SpO2 99%   Final Clinical Impression(s) / ED Diagnoses Final diagnoses:  Pharyngitis, unspecified etiology    Rx / DC Orders ED Discharge Orders          Ordered    lidocaine (XYLOCAINE) 2 % solution  Every 6 hours PRN        03/10/21 1028    acetaminophen (TYLENOL) 500 MG tablet  Every 6 hours PRN        03/10/21 1028           10:26 AM Patient here with complaints of sore throat.  Had no other cold symptoms.  He has normal phonation, throat was erythematous however no evidence of peritonsillar abscess or signs of deep tissue infection.  Strep test negative.  Viral respiratory panel currently pending.  Patient can follow result through MyChart.  Will provide symptom control   Fayrene Helper, PA-C 03/10/21 1029    Melene Plan, DO 03/10/21 1242

## 2021-10-11 DIAGNOSIS — N469 Male infertility, unspecified: Secondary | ICD-10-CM | POA: Diagnosis not present

## 2021-10-11 DIAGNOSIS — I861 Scrotal varices: Secondary | ICD-10-CM | POA: Diagnosis not present

## 2021-10-13 ENCOUNTER — Other Ambulatory Visit: Payer: Self-pay

## 2021-10-13 ENCOUNTER — Encounter (HOSPITAL_BASED_OUTPATIENT_CLINIC_OR_DEPARTMENT_OTHER): Payer: Self-pay

## 2021-10-13 ENCOUNTER — Emergency Department (HOSPITAL_BASED_OUTPATIENT_CLINIC_OR_DEPARTMENT_OTHER)
Admission: EM | Admit: 2021-10-13 | Discharge: 2021-10-13 | Disposition: A | Discharge status: Home / Self Care | Payer: Federal, State, Local not specified - PPO | Attending: Emergency Medicine | Admitting: Emergency Medicine

## 2021-10-13 DIAGNOSIS — Z23 Encounter for immunization: Secondary | ICD-10-CM | POA: Insufficient documentation

## 2021-10-13 DIAGNOSIS — S6992XA Unspecified injury of left wrist, hand and finger(s), initial encounter: Secondary | ICD-10-CM | POA: Diagnosis not present

## 2021-10-13 DIAGNOSIS — S61211A Laceration without foreign body of left index finger without damage to nail, initial encounter: Secondary | ICD-10-CM | POA: Diagnosis not present

## 2021-10-13 DIAGNOSIS — W268XXA Contact with other sharp object(s), not elsewhere classified, initial encounter: Secondary | ICD-10-CM | POA: Diagnosis not present

## 2021-10-13 MED ORDER — TETANUS-DIPHTH-ACELL PERTUSSIS 5-2.5-18.5 LF-MCG/0.5 IM SUSY
0.5000 mL | PREFILLED_SYRINGE | Freq: Once | INTRAMUSCULAR | Status: AC
  Administered 2021-10-13: 0.5 mL via INTRAMUSCULAR

## 2021-10-13 NOTE — ED Provider Notes (Signed)
[facility] EMERGENCY DEPT Provider Note   CSN: [id number] Arrival date & time: 10/13/21  1104     History  Chief Complaint  Patient presents with   Laceration    Daniel Mccarthy is a 26 y.o. male.  Patient presents to the hospital complaining of a laceration to the distal tip of the left index finger.  Patient works in the [employer] and cut his finger on a box.  This occurred approximately 10 PM last night.  Bleeding is controlled at this time.  Patient is unsure of his last tetanus booster.  No relevant past medical history  HPI     Home Medications Prior to Admission medications   Medication Sig Start Date End Date Taking? Authorizing Provider  acetaminophen (TYLENOL) 500 MG tablet Take 1 tablet (500 mg total) by mouth every 6 (six) hours as needed. 03/10/21   Fayrene Helper, PA-C  amoxicillin-clavulanate (AUGMENTIN) 875-125 MG tablet Take 1 tablet by mouth 2 (two) times daily.    [provider]  cetirizine HCl (ZYRTEC) 5 MG/5ML SOLN Take 10 mLs by mouth daily.    [provider]  Cholecalciferol (VITAMIN D3) 50 MCG (2000 UT) TABS Take 1 tablet by mouth daily.    [provider]  cyclobenzaprine (FLEXERIL) 5 MG tablet Take 5 mg by mouth 3 (three) times daily. 02/26/18   [provider]  lidocaine (XYLOCAINE) 2 % solution Use as directed 15 mLs in the mouth or throat every 6 (six) hours as needed for mouth pain. 03/10/21   Fayrene Helper, PA-C  methocarbamol (ROBAXIN) 500 MG tablet Take 1 tablet (500 mg total) by mouth 2 (two) times daily. 10/11/19   Harris, Cammy Copa, PA-C  naproxen (NAPROSYN) 375 MG tablet Take 1 tablet (375 mg total) by mouth 2 (two) times daily. 10/11/19   Arthor Captain, PA-C      Allergies    Patient has no known allergies.    Review of Systems   Review of Systems  Skin:  Positive for wound.    Physical Exam Updated Vital Signs BP 122/76 (BP Location: Right Arm)   Pulse 64   Temp 98.5 F (36.9 C)    Resp 14   Ht 5\' 11"  (1.803 m)   Wt 78.5 kg   SpO2 100%   BMI 24.13 kg/m  Physical Exam Vitals and nursing note reviewed.  HENT:     Head: Normocephalic.  Eyes:     Conjunctiva/sclera: Conjunctivae normal.  Cardiovascular:     Rate and Rhythm: Normal rate.  Pulmonary:     Effort: Pulmonary effort is normal.  Musculoskeletal:        General: Normal range of motion.     Cervical back: Normal range of motion.  Skin:    Comments: 0.75 cm laceration to distal tip of left index finger.  Brisk cap refill  Neurological:     Mental Status: He is alert and oriented to person, place, and time.     ED Results / Procedures / Treatments   Labs (all labs ordered are listed, but only abnormal results are displayed) Labs Reviewed - No data to display  EKG None  Radiology No results found.  Procedures . Laceration Repair  Date/Time: 10/13/2021 12:15 PM  Performed by: 12/14/2021, PA-C Authorized by: Darrick Grinder, PA-C   Consent:    Consent obtained:  Verbal   Consent given by:  Patient   Risks discussed:  Infection, need for additional repair, pain, poor cosmetic result and  poor wound healing   Alternatives discussed:  No treatment and delayed treatment Universal protocol:    Procedure explained and questions answered to patient or proxy's satisfaction: yes     Relevant documents present and verified: yes     Required blood products, implants, devices, and special equipment available: yes     Site/side marked: yes     Immediately prior to procedure, a time out was called: yes     Patient identity confirmed:  Verbally with patient Anesthesia:    Anesthesia method:  None Laceration details:    Location:  Finger   Finger location:  L index finger   Length (cm):  0.8   Depth (mm):  3 Pre-procedure details:    Preparation:  Patient was prepped and draped in usual sterile fashion Treatment:    Area cleansed with:  Povidone-iodine   Amount of cleaning:   Standard Skin repair:    Repair method:  Tissue adhesive Approximation:    Approximation:  Close Repair type:    Repair type:  Simple Post-procedure details:    Dressing:  Open (no dressing)   Procedure completion:  Tolerated well, no immediate complications     Medications Ordered in ED Medications  Tdap (BOOSTRIX) injection 0.5 mL (has no administration in time range)    ED Course/ Medical Decision Making/ A&P                           Medical Decision Making  Patient presents with a simple laceration to the left index finger.  No gross contamination.  No concerns for foreign bodies.  Depth of wound was visualized.  This was repaired as noted above with tissue adhesive.  The patient tolerated the procedure well.  The patient was unsure as to his last tetanus administration so a booster was administered at today's visit.  Patient given instructions on wound care including not removing the glue until it begins to fall off on its own.  Patient may discharge home at this time        Final Clinical Impression(s) / ED Diagnoses Final diagnoses:  Laceration of left index finger without foreign body without damage to nail, initial encounter    Rx / DC Orders ED Discharge Orders     None         Pamala Duffel 10/13/21 1229    Rolan Bucco, MD 10/13/21 [id number]

## 2021-10-13 NOTE — Discharge Instructions (Addendum)
You were seen today for a laceration of the finger which was repaired with surgical glue.  Please allow the glue to fall off on its own.  Do not peel it.  You should be safe to get the finger wet tonight.  I recommend not scrubbing the affected finger vigorously as that may dislodge the glue.  No follow-up should be required.  You were also administered a tetanus booster due to no available records showing recent vaccination.

## 2021-10-13 NOTE — ED Triage Notes (Signed)
Patient here POV from Home.  Endorses lacerating his Left Distal Index Finger with a Box Cutter at 2200 yesterday PM. Bleeding Controlled.  Tetanus is Unknown.   NAD Noted during Triage. A&Ox4. GCS 15. Ambulatory.

## 2021-10-18 DIAGNOSIS — N469 Male infertility, unspecified: Secondary | ICD-10-CM | POA: Diagnosis not present

## 2021-10-26 ENCOUNTER — Other Ambulatory Visit: Payer: Self-pay

## 2021-10-26 ENCOUNTER — Encounter (HOSPITAL_BASED_OUTPATIENT_CLINIC_OR_DEPARTMENT_OTHER): Payer: Self-pay | Admitting: Emergency Medicine

## 2021-10-26 DIAGNOSIS — R112 Nausea with vomiting, unspecified: Secondary | ICD-10-CM | POA: Diagnosis not present

## 2021-10-26 DIAGNOSIS — J029 Acute pharyngitis, unspecified: Secondary | ICD-10-CM | POA: Insufficient documentation

## 2021-10-26 DIAGNOSIS — R1110 Vomiting, unspecified: Secondary | ICD-10-CM | POA: Diagnosis not present

## 2021-10-26 MED ORDER — ONDANSETRON 4 MG PO TBDP
4.0000 mg | ORAL_TABLET | Freq: Once | ORAL | Status: AC | PRN
  Administered 2021-10-27: 4 mg via ORAL
  Filled 2021-10-26: qty 1

## 2021-10-26 NOTE — ED Triage Notes (Signed)
Vomiting started today. X4 Not keeping anything down. Throat in now sore after vomiting

## 2021-10-27 ENCOUNTER — Emergency Department (HOSPITAL_BASED_OUTPATIENT_CLINIC_OR_DEPARTMENT_OTHER)
Admission: EM | Admit: 2021-10-27 | Discharge: 2021-10-27 | Disposition: A | Discharge status: Home / Self Care | Payer: Federal, State, Local not specified - PPO | Attending: Emergency Medicine | Admitting: Emergency Medicine

## 2021-10-27 DIAGNOSIS — R112 Nausea with vomiting, unspecified: Secondary | ICD-10-CM

## 2021-10-27 LAB — URINALYSIS, ROUTINE W REFLEX MICROSCOPIC
Bilirubin Urine: NEGATIVE
Glucose, UA: NEGATIVE mg/dL
Hgb urine dipstick: NEGATIVE
Ketones, ur: NEGATIVE mg/dL
Leukocytes,Ua: NEGATIVE
Nitrite: NEGATIVE
Specific Gravity, Urine: 1.024 (ref 1.005–1.030)
pH: 7.5 (ref 5.0–8.0)

## 2021-10-27 LAB — CBC
HCT: 42.7 % (ref 39.0–52.0)
Hemoglobin: 14.4 g/dL (ref 13.0–17.0)
MCH: 28.9 pg (ref 26.0–34.0)
MCHC: 33.7 g/dL (ref 30.0–36.0)
MCV: 85.7 fL (ref 80.0–100.0)
Platelets: 258 10*3/uL (ref 150–400)
RBC: 4.98 MIL/uL (ref 4.22–5.81)
RDW: 12.4 % (ref 11.5–15.5)
WBC: 8.1 10*3/uL (ref 4.0–10.5)
nRBC: 0 % (ref 0.0–0.2)

## 2021-10-27 LAB — COMPREHENSIVE METABOLIC PANEL
ALT: 7 U/L (ref 0–44)
AST: 18 U/L (ref 15–41)
Albumin: 5 g/dL (ref 3.5–5.0)
Alkaline Phosphatase: 64 U/L (ref 38–126)
Anion gap: 10 (ref 5–15)
BUN: 13 mg/dL (ref 6–20)
CO2: 25 mmol/L (ref 22–32)
Calcium: 10 mg/dL (ref 8.9–10.3)
Chloride: 104 mmol/L (ref 98–111)
Creatinine, Ser: 0.89 mg/dL (ref 0.61–1.24)
GFR, Estimated: >60 mL/min (ref >60)
Glucose, Bld: 88 mg/dL (ref 70–99)
Potassium: 3.7 mmol/L (ref 3.5–5.1)
Sodium: 139 mmol/L (ref 135–145)
Total Bilirubin: 0.9 mg/dL (ref 0.3–1.2)
Total Protein: 8.3 g/dL — ABNORMAL HIGH (ref 6.5–8.1)

## 2021-10-27 LAB — LIPASE, BLOOD: Lipase: 56 U/L — ABNORMAL HIGH (ref 11–51)

## 2021-10-27 MED ORDER — ALUM & MAG HYDROXIDE-SIMETH 200-200-20 MG/5ML PO SUSP
30.0000 mL | Freq: Once | ORAL | Status: AC
  Administered 2021-10-27: 30 mL via ORAL
  Filled 2021-10-27: qty 30

## 2021-10-27 MED ORDER — ONDANSETRON 4 MG PO TBDP: 4.0000 mg | ORAL_TABLET | Freq: Three times a day (TID) | ORAL | 0 refills | Status: DC | PRN

## 2021-10-27 MED ORDER — LIDOCAINE VISCOUS HCL 2 % MT SOLN
15.0000 mL | Freq: Once | OROMUCOSAL | Status: AC
  Administered 2021-10-27: 15 mL via OROMUCOSAL

## 2021-10-27 MED ORDER — LIDOCAINE VISCOUS HCL 2 % MT SOLN: 15.0000 mL | Freq: Four times a day (QID) | OROMUCOSAL | 0 refills | Status: DC | PRN

## 2021-10-27 NOTE — ED Provider Notes (Signed)
[facility] EMERGENCY DEPT  Provider Note  CSN: [id number] Arrival date & time: 10/26/21 2336  History Chief Complaint  Patient presents with   Emesis    Daniel Mccarthy is a 26 y.o. male with no significant PMH reports he began vomiting around 9pm tonight, multiple episodes of non blood emesis and now with a sore throat. Nausea improved with zofran given in triage. No abdominal pain or diarrhea.    Home Medications Prior to Admission medications   Medication Sig Start Date End Date Taking? Authorizing Provider  ondansetron (ZOFRAN-ODT) 4 MG disintegrating tablet Take 1 tablet (4 mg total) by mouth every 8 (eight) hours as needed for nausea or vomiting. 10/27/21  Yes Pollyann Savoy, MD  acetaminophen (TYLENOL) 500 MG tablet Take 1 tablet (500 mg total) by mouth every 6 (six) hours as needed. 03/10/21   Fayrene Helper, PA-C  cetirizine HCl (ZYRTEC) 5 MG/5ML SOLN Take 10 mLs by mouth daily.    [provider]  Cholecalciferol (VITAMIN D3) 50 MCG (2000 UT) TABS Take 1 tablet by mouth daily.    [provider]  cyclobenzaprine (FLEXERIL) 5 MG tablet Take 5 mg by mouth 3 (three) times daily. 02/26/18   [provider]  lidocaine (XYLOCAINE) 2 % solution Use as directed 15 mLs in the mouth or throat every 6 (six) hours as needed for mouth pain. 10/27/21   Pollyann Savoy, MD     Allergies    Patient has no known allergies.   Review of Systems   Review of Systems Please see HPI for pertinent positives and negatives  Physical Exam BP 117/77 (BP Location: Right Arm)   Pulse 70   Temp 97.7 F (36.5 C)   Resp 16   SpO2 98%   Physical Exam Vitals and nursing note reviewed.  Constitutional:      Appearance: Normal appearance.  HENT:     Head: Normocephalic and atraumatic.     Nose: Nose normal.     Mouth/Throat:     Mouth: Mucous membranes are moist.     Pharynx: Posterior oropharyngeal erythema present. No oropharyngeal exudate.   Eyes:     Extraocular Movements: Extraocular movements intact.     Conjunctiva/sclera: Conjunctivae normal.  Cardiovascular:     Rate and Rhythm: Normal rate.  Pulmonary:     Effort: Pulmonary effort is normal.     Breath sounds: Normal breath sounds.  Abdominal:     General: Abdomen is flat.     Palpations: Abdomen is soft.     Tenderness: There is no abdominal tenderness.  Musculoskeletal:        General: No swelling. Normal range of motion.     Cervical back: Neck supple.  Skin:    General: Skin is warm and dry.  Neurological:     General: No focal deficit present.     Mental Status: He is alert.  Psychiatric:        Mood and Affect: Mood normal.     ED Results / Procedures / Treatments   EKG None  Procedures Procedures  Medications Ordered in the ED Medications  ondansetron (ZOFRAN-ODT) disintegrating tablet 4 mg (4 mg Oral Given 10/27/21 0006)  alum & mag hydroxide-simeth (MAALOX/MYLANTA) 200-200-20 MG/5ML suspension 30 mL (30 mLs Oral Given 10/27/21 0156)  lidocaine (XYLOCAINE) 2 % viscous mouth solution 15 mL (15 mLs Mouth/Throat Given 10/27/21 0156)    Initial Impression and Plan  Patient with some throat irritation after vomiting, but overall  well appearing with a benign exam. Labs done in triage show normal CBC, BMP and UA. Plan GI cocktail to help with residual throat irritation and if tolerates PO trial discharge home with Rx for Zofran.   ED Course       MDM Rules/Calculators/A&P Medical Decision Making Problems Addressed: Nausea and vomiting, unspecified vomiting type: acute illness or injury  Amount and/or Complexity of Data Reviewed Labs: ordered. Decision-making details documented in ED Course.  Risk OTC drugs. Prescription drug management.    Final Clinical Impression(s) / ED Diagnoses Final diagnoses:  Nausea and vomiting, unspecified vomiting type    Rx / DC Orders ED Discharge Orders          Ordered    lidocaine (XYLOCAINE) 2  % solution  Every 6 hours PRN        10/27/21 0202    ondansetron (ZOFRAN-ODT) 4 MG disintegrating tablet  Every 8 hours PRN        10/27/21 0202             Pollyann Savoy, MD 10/27/21 0202

## 2021-10-27 NOTE — ED Notes (Signed)
Pass PO challenge

## 2021-11-05 ENCOUNTER — Ambulatory Visit: Payer: Federal, State, Local not specified - PPO | Admitting: Nurse Practitioner

## 2021-11-05 ENCOUNTER — Encounter: Payer: Self-pay | Admitting: Nurse Practitioner

## 2021-11-05 VITALS — BP 118/76 | HR 68 | Temp 96.9°F | Ht 70.0 in | Wt 175.4 lb

## 2021-11-05 DIAGNOSIS — G8929 Other chronic pain: Secondary | ICD-10-CM | POA: Insufficient documentation

## 2021-11-05 DIAGNOSIS — F419 Anxiety disorder, unspecified: Secondary | ICD-10-CM

## 2021-11-05 DIAGNOSIS — G43709 Chronic migraine without aura, not intractable, without status migrainosus: Secondary | ICD-10-CM

## 2021-11-05 DIAGNOSIS — F32A Depression, unspecified: Secondary | ICD-10-CM | POA: Diagnosis not present

## 2021-11-05 DIAGNOSIS — M545 Low back pain, unspecified: Secondary | ICD-10-CM

## 2021-11-05 MED ORDER — AMITRIPTYLINE HCL 10 MG PO TABS: 10.0000 mg | ORAL_TABLET | Freq: Every day | ORAL | 1 refills | Status: DC

## 2021-11-05 MED ORDER — SUMATRIPTAN SUCCINATE 50 MG PO TABS: 50.0000 mg | ORAL_TABLET | ORAL | 0 refills | Status: AC | PRN

## 2021-11-05 NOTE — Assessment & Plan Note (Signed)
Symptoms most consistent with migraines that are not controlled. He states that the headaches have caused him to miss work intermittently. He is currently getting headaches about twice a week. Will have him start imitrex as needed for migraine and amitrtiptyline 10mg  at bedtime to help prevent migraines. FMLA paper work completed for intermittent FMLA. Follow-up in 4-6 weeks.
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
## 2021-11-05 NOTE — Patient Instructions (Addendum)
It was great to see you!  You can take tylenol or ibuprofen as needed for your back pain. You can also use a heating pad or a liodocaine patch. You put this on in the morning and take it off at night. I am also giving you stretches to do every day.   For your migraines, you can start imitrex 1 tablet at the first sign of a headache. Start amitriptyline 1 tablet at bedtime to prevent migraines. You take this every day.   I have placed a referral to therapy.   Let's follow-up in 4 weeks, sooner if you have concerns.  If a referral was placed today, you will be contacted for an appointment. Please note that routine referrals can sometimes take up to 3-4 weeks to process. Please call our office if you haven't heard anything after this time frame.  Take care,  Rodman Pickle, NP

## 2021-11-05 NOTE — Assessment & Plan Note (Signed)
Chronic, not controlled. He endorses a lot of stress recently. His PHQ 9 is a 14 and his GAD 7 is a 15. He denies SI/HI. He is not interested in medication right now. Will place a referral to therapy. Also discussed deep breathing, meditation, and exercise to help mood. Follow-up in 4-6 weeks.
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
## 2021-11-06 ENCOUNTER — Telehealth: Payer: Self-pay

## 2021-11-06 NOTE — Telephone Encounter (Signed)
Called and informed pt FMLA forms are completed and will be available for pick up at [facility]. Sw, cma

## 2021-12-01 NOTE — Progress Notes (Unsigned)
   Established Patient Office Visit  Subjective   Patient ID: Daniel Mccarthy, male    DOB: 08-26-1995  Age: 26 y.o. MRN: [id number]  No chief complaint on file.   HPI  Daniel Mccarthy is here to follow-up on migraines. Last visit he was started on amitriptyline 10mg  at bedtime and imitrex prn migraine.   {History (Optional):23778}  ROS    Objective:     There were no vitals taken for this visit. {Vitals History (Optional):23777}  Physical Exam   No results found for any visits on 12/03/21.  {Labs (Optional):23779}  The ASCVD Risk score (Arnett DK, et al., 2019) failed to calculate for the following reasons:   The 2019 ASCVD risk score is only valid for ages 73 to 67    Assessment & Plan:   Problem List Items Addressed This Visit   None   No follow-ups on file.    76, NP

## 2021-12-03 ENCOUNTER — Encounter: Payer: Self-pay | Admitting: Nurse Practitioner

## 2021-12-03 ENCOUNTER — Other Ambulatory Visit (HOSPITAL_COMMUNITY)
Admission: RE | Admit: 2021-12-03 | Discharge: 2021-12-03 | Disposition: A | Discharge status: Home / Self Care | Payer: Federal, State, Local not specified - PPO | Source: Ambulatory Visit | Attending: Nurse Practitioner | Admitting: Nurse Practitioner

## 2021-12-03 ENCOUNTER — Ambulatory Visit (INDEPENDENT_AMBULATORY_CARE_PROVIDER_SITE_OTHER): Payer: Federal, State, Local not specified - PPO | Admitting: Nurse Practitioner

## 2021-12-03 VITALS — BP 124/70 | HR 70 | Wt 176.2 lb

## 2021-12-03 DIAGNOSIS — Z113 Encounter for screening for infections with a predominantly sexual mode of transmission: Secondary | ICD-10-CM | POA: Diagnosis not present

## 2021-12-03 DIAGNOSIS — E559 Vitamin D deficiency, unspecified: Secondary | ICD-10-CM | POA: Diagnosis not present

## 2021-12-03 DIAGNOSIS — G43709 Chronic migraine without aura, not intractable, without status migrainosus: Secondary | ICD-10-CM

## 2021-12-03 LAB — VITAMIN D 25 HYDROXY (VIT D DEFICIENCY, FRACTURES): VITD: 20.87 ng/mL — ABNORMAL LOW (ref 30.00–100.00)

## 2021-12-03 MED ORDER — AMITRIPTYLINE HCL 10 MG PO TABS: 10.0000 mg | ORAL_TABLET | Freq: Every day | ORAL | 1 refills | Status: DC

## 2021-12-03 NOTE — Patient Instructions (Signed)
It was great to see you!  We are checking your vitamin D levels and will let you know the results.   Continue the amitriptyline at bedtime to help prevent your migraines.   We are checking for STDs today.   Let's follow-up in 6 months, sooner if you have concerns.  If a referral was placed today, you will be contacted for an appointment. Please note that routine referrals can sometimes take up to 3-4 weeks to process. Please call our office if you haven't heard anything after this time frame.  Take care,  Rodman Pickle, NP

## 2021-12-03 NOTE — Assessment & Plan Note (Signed)
Chronic, improved and stable. He has not had a migraine since he started the amitriptyline and is not having any side effects to the medication. Will have him continue amitriptyline 10mg  daily at bedtime. Follow-up in 6 months.

## 2021-12-04 ENCOUNTER — Telehealth: Payer: Self-pay | Admitting: Nurse Practitioner

## 2021-12-04 LAB — URINE CYTOLOGY ANCILLARY ONLY
Chlamydia: NEGATIVE
Comment: NEGATIVE
Comment: NORMAL
Neisseria Gonorrhea: NEGATIVE

## 2021-12-04 LAB — HEPATITIS C ANTIBODY: Hepatitis C Ab: NONREACTIVE

## 2021-12-04 LAB — HIV ANTIBODY (ROUTINE TESTING W REFLEX): HIV 1&2 Ab, 4th Generation: NONREACTIVE

## 2021-12-04 LAB — RPR: RPR Ser Ql: NONREACTIVE

## 2021-12-04 NOTE — Telephone Encounter (Signed)
Caller Name: Daniel Mccarthy Call back phone #: [phone]  Reason for Call: Is looking to gain clearance for the [place] [employer], has questions for what he needs to do in order to get this from [facility]

## 2021-12-06 NOTE — Telephone Encounter (Signed)
Called and ldvm, Letter is ready for pick up at [facility]. Sw,cma Advised to call back directly if there are further questions.

## 2021-12-25 ENCOUNTER — Other Ambulatory Visit: Payer: Self-pay

## 2021-12-25 ENCOUNTER — Emergency Department (HOSPITAL_BASED_OUTPATIENT_CLINIC_OR_DEPARTMENT_OTHER)
Admission: EM | Admit: 2021-12-25 | Discharge: 2021-12-25 | Disposition: A | Discharge status: Home / Self Care | Payer: Federal, State, Local not specified - PPO | Attending: Emergency Medicine | Admitting: Emergency Medicine

## 2021-12-25 ENCOUNTER — Encounter (HOSPITAL_BASED_OUTPATIENT_CLINIC_OR_DEPARTMENT_OTHER): Payer: Self-pay | Admitting: *Deleted

## 2021-12-25 DIAGNOSIS — H5711 Ocular pain, right eye: Secondary | ICD-10-CM | POA: Diagnosis not present

## 2021-12-25 DIAGNOSIS — H209 Unspecified iridocyclitis: Secondary | ICD-10-CM | POA: Insufficient documentation

## 2021-12-25 MED ORDER — CYCLOPENTOLATE HCL 1 % OP SOLN
1.0000 [drp] | Freq: Once | OPHTHALMIC | Status: AC
  Administered 2021-12-25: 1 [drp] via OPHTHALMIC
  Filled 2021-12-25: qty 2

## 2021-12-25 MED ORDER — TETRACAINE HCL 0.5 % OP SOLN
2.0000 [drp] | Freq: Once | OPHTHALMIC | Status: AC
  Administered 2021-12-25: 2 [drp] via OPHTHALMIC
  Filled 2021-12-25: qty 4

## 2021-12-25 MED ORDER — FLUORESCEIN SODIUM 1 MG OP STRP
1.0000 | ORAL_STRIP | Freq: Once | OPHTHALMIC | Status: AC
  Administered 2021-12-25: 1 via OPHTHALMIC
  Filled 2021-12-25: qty 1

## 2021-12-25 NOTE — ED Notes (Signed)
ED Provider at bedside. 
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
## 2021-12-25 NOTE — Discharge Instructions (Signed)
Use the Cyclopentolate eyedrops 1-2 drops 2-3 times a day. Make sure that you wear VERY DARK sunglasses except when sleeping. Dr. Allena Katz will see you in his office tomorrow for a complete exam.  Get help right away if: You have vision loss in either eye. You have more redness in one or both eyes. Your eyes become more sensitive to light. You have more pain or aching in either eye.

## 2021-12-25 NOTE — ED Notes (Signed)
Supplies at bedside for evaluation of pt by MD

## 2021-12-26 DIAGNOSIS — H11431 Conjunctival hyperemia, right eye: Secondary | ICD-10-CM | POA: Diagnosis not present

## 2021-12-26 DIAGNOSIS — H5711 Ocular pain, right eye: Secondary | ICD-10-CM | POA: Diagnosis not present

## 2021-12-26 DIAGNOSIS — H20011 Primary iridocyclitis, right eye: Secondary | ICD-10-CM | POA: Diagnosis not present

## 2021-12-26 DIAGNOSIS — H53141 Visual discomfort, right eye: Secondary | ICD-10-CM | POA: Diagnosis not present

## 2022-01-02 DIAGNOSIS — H11431 Conjunctival hyperemia, right eye: Secondary | ICD-10-CM | POA: Diagnosis not present

## 2022-01-02 DIAGNOSIS — H20011 Primary iridocyclitis, right eye: Secondary | ICD-10-CM | POA: Diagnosis not present

## 2022-01-02 DIAGNOSIS — S0511XD Contusion of eyeball and orbital tissues, right eye, subsequent encounter: Secondary | ICD-10-CM | POA: Diagnosis not present

## 2022-01-02 DIAGNOSIS — H53141 Visual discomfort, right eye: Secondary | ICD-10-CM | POA: Diagnosis not present

## 2022-05-07 DIAGNOSIS — K080 Exfoliation of teeth due to systemic causes: Secondary | ICD-10-CM | POA: Diagnosis not present

## 2022-05-25 ENCOUNTER — Emergency Department (HOSPITAL_BASED_OUTPATIENT_CLINIC_OR_DEPARTMENT_OTHER): Payer: Federal, State, Local not specified - PPO

## 2022-05-25 ENCOUNTER — Encounter (HOSPITAL_BASED_OUTPATIENT_CLINIC_OR_DEPARTMENT_OTHER): Payer: Self-pay | Admitting: Emergency Medicine

## 2022-05-25 ENCOUNTER — Other Ambulatory Visit: Payer: Self-pay

## 2022-05-25 ENCOUNTER — Emergency Department (HOSPITAL_BASED_OUTPATIENT_CLINIC_OR_DEPARTMENT_OTHER)
Admission: EM | Admit: 2022-05-25 | Discharge: 2022-05-25 | Disposition: A | Discharge status: Home / Self Care | Payer: Federal, State, Local not specified - PPO | Attending: Emergency Medicine | Admitting: Emergency Medicine

## 2022-05-25 DIAGNOSIS — M79631 Pain in right forearm: Secondary | ICD-10-CM | POA: Diagnosis not present

## 2022-05-25 DIAGNOSIS — S60221A Contusion of right hand, initial encounter: Secondary | ICD-10-CM | POA: Insufficient documentation

## 2022-05-25 DIAGNOSIS — M25521 Pain in right elbow: Secondary | ICD-10-CM | POA: Diagnosis not present

## 2022-05-25 DIAGNOSIS — W010XXA Fall on same level from slipping, tripping and stumbling without subsequent striking against object, initial encounter: Secondary | ICD-10-CM | POA: Diagnosis not present

## 2022-05-25 DIAGNOSIS — M79641 Pain in right hand: Secondary | ICD-10-CM | POA: Diagnosis not present

## 2022-05-25 DIAGNOSIS — S53401A Unspecified sprain of right elbow, initial encounter: Secondary | ICD-10-CM | POA: Insufficient documentation
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
## 2022-05-25 NOTE — Discharge Instructions (Signed)
Ice for 20 minutes every 2 hours while awake for the next 2 days.  Rest.  Take ibuprofen 600 mg every 6 hours as needed for pain.  Follow-up with your primary doctor if symptoms are not improving in the next week.

## 2022-05-25 NOTE — ED Triage Notes (Signed)
  Patient comes in with R arm injury after a fall that occurred around 2230 last night.  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  When falling he attempted to catch himself with his R arm and landed on gravel in the ditch.  No LOC.  Patient has small abrasion on middle of forehead but denies hitting his head.  Abrasions on R hand and swelling.  Pain 8/10, aching/sore.

## 2022-06-05 ENCOUNTER — Encounter: Payer: Self-pay | Admitting: Nurse Practitioner

## 2022-06-05 ENCOUNTER — Ambulatory Visit: Payer: Federal, State, Local not specified - PPO | Admitting: Nurse Practitioner

## 2022-06-05 VITALS — BP 120/80 | HR 64 | Temp 98.0°F | Ht 71.0 in | Wt 198.0 lb

## 2022-06-05 DIAGNOSIS — G43709 Chronic migraine without aura, not intractable, without status migrainosus: Secondary | ICD-10-CM | POA: Diagnosis not present

## 2022-06-05 DIAGNOSIS — J3089 Other allergic rhinitis: Secondary | ICD-10-CM

## 2022-06-05 MED ORDER — FLUTICASONE PROPIONATE 50 MCG/ACT NA SUSP: 2.0000 | Freq: Every day | NASAL | 6 refills | Status: AC

## 2022-06-05 MED ORDER — NORTRIPTYLINE HCL 10 MG PO CAPS: 10.0000 mg | ORAL_CAPSULE | Freq: Every day | ORAL | 1 refills | Status: AC

## 2022-06-05 NOTE — Assessment & Plan Note (Signed)
Chronic, ongoing.  Will have him start Flonase nasal spray daily and cetirizine 10 mg daily.  Follow-up if symptoms worsen or do not improve.

## 2022-06-05 NOTE — Patient Instructions (Signed)
It was great to see you!  Start flonase nasal spray once a day in both nostrils for your allergies. You can also zyrtec (cetirizine) once a day as well. This is over the counter.   I have switched your amitriptyline to nortriptyline to help with the weight gain. Still take this at bedtime.   Let's follow-up in 3 months, sooner if you have concerns.  If a referral was placed today, you will be contacted for an appointment. Please note that routine referrals can sometimes take up to 3-4 weeks to process. Please call our office if you haven't heard anything after this time frame.  Take care,  Vance Peper, NP

## 2022-06-05 NOTE — Progress Notes (Signed)
Established Patient Office Visit  Subjective   Patient ID: Daniel Mccarthy, male    DOB: October 22, 1995  Age: 27 y.o. MRN: [id number]  Chief Complaint  Patient presents with   Migraine    Follow up within migraine medication, concerns with weight gain for 2 months, request something for allergies    Daniel Mccarthy is here to follow-up on migraines and he also has been experiencing allergies.   He states that his migraines are doing better and stable.  He has been taking nortriptyline 10 mg at bedtime and Imitrex 50 mg as needed for acute migraine.  He has noticed that he has gained about 20 to 25 pounds in the last 6 months.  He has not changed his eating habits and exercises regularly.  He is unsure why he is gaining this weight.  He has been experiencing nasal congestion and heavy eyes. He denies fevers, sore throat, and ear pain. He has tried claritin without relief.  He states that the symptoms tend to last throughout the year and are not seasonal.    ROS See pertinent positives and negatives per HPI.    Objective:     BP 120/80 (BP Location: Right Arm)   Pulse 64   Temp 98 F (36.7 C)   Ht 5' 11"$  (1.803 m)   Wt 198 lb (89.8 kg)   SpO2 97%   BMI 27.62 kg/m  BP Readings from Last 3 Encounters:  06/05/22 120/80  05/25/22 123/70  12/25/21 120/69   Wt Readings from Last 3 Encounters:  06/05/22 198 lb (89.8 kg)  05/25/22 176 lb (79.8 kg)  12/03/21 176 lb 3.2 oz (79.9 kg)      Physical Exam Vitals and nursing note reviewed.  Constitutional:      Appearance: Normal appearance.  HENT:     Head: Normocephalic.     Right Ear: Tympanic membrane, ear canal and external ear normal.     Left Ear: Tympanic membrane, ear canal and external ear normal.     Nose: Congestion present.     Mouth/Throat:     Pharynx: No oropharyngeal exudate or posterior oropharyngeal erythema.  Eyes:     Conjunctiva/sclera: Conjunctivae normal.  Cardiovascular:     Rate and Rhythm: Normal  rate and regular rhythm.     Pulses: Normal pulses.     Heart sounds: Normal heart sounds.  Pulmonary:     Effort: Pulmonary effort is normal.     Breath sounds: Normal breath sounds.  Musculoskeletal:     Cervical back: Normal range of motion and neck supple. No tenderness.  Lymphadenopathy:     Cervical: No cervical adenopathy.  Skin:    General: Skin is warm.  Neurological:     General: No focal deficit present.     Mental Status: He is alert and oriented to person, place, and time.  Psychiatric:        Mood and Affect: Mood normal.        Behavior: Behavior normal.        Thought Content: Thought content normal.        Judgment: Judgment normal.      Assessment & Plan:   Problem List Items Addressed This Visit       Cardiovascular and Mediastinum   Chronic migraine without aura without status migrainosus, not intractable - Primary    Chronic, stable.  His migraines are currently well-controlled, however he has gained about 25 pounds since starting the amitriptyline.  Will  switch him to nortriptyline 10 mg at bedtime.  Continue Imitrex 50 mg as needed for migraine.  Follow-up in 3 months.      Relevant Medications   nortriptyline (PAMELOR) 10 MG capsule     Respiratory   Non-seasonal allergic rhinitis    Chronic, ongoing.  Will have him start Flonase nasal spray daily and cetirizine 10 mg daily.  Follow-up if symptoms worsen or do not improve.       Return in about 3 months (around 09/03/2022) for CPE.    Charyl Dancer, NP

## 2022-06-05 NOTE — Assessment & Plan Note (Signed)
Chronic, stable.  His migraines are currently well-controlled, however he has gained about 25 pounds since starting the amitriptyline.  Will switch him to nortriptyline 10 mg at bedtime.  Continue Imitrex 50 mg as needed for migraine.  Follow-up in 3 months.

## 2022-06-05 NOTE — Addendum Note (Signed)
Addended by: Vance Peper A on: 06/05/2022 09:28 AM   Modules accepted: Level of Service

## 2022-06-10 ENCOUNTER — Telehealth: Payer: Self-pay

## 2022-06-10 NOTE — Telephone Encounter (Signed)
I spoke with patient and he said that he previous FMLA forms had expired. The forms are for his migraines that if he has them that he is excused for two days out the week.

## 2022-06-10 NOTE — Telephone Encounter (Signed)
LVM for patient to return call.  Office received FMLA forms to be filled out.

## 2022-06-11 NOTE — Telephone Encounter (Signed)
Pt called in to speak with [place]. Placed on hold and went to check if he picked up paperwork. Pt disconnected before I returned (20 seconds).

## 2022-06-11 NOTE — Telephone Encounter (Signed)
LVM that paperwork completed and ready to be picked up.    CLINICAL USE BELOW THIS LINE (use X to signify action taken)  ___ Form received and placed in providers office for signature. ___ Form completed and faxed to LOA Dept.  __x_ Form completed & LVM to notify patient ready for pick up.  __x_ Charge sheet and copy of form in front office folder for office supervisor.

## 2022-06-18 NOTE — Telephone Encounter (Signed)
Pt picked up paperwork.

## 2023-03-31 ENCOUNTER — Ambulatory Visit: Payer: Federal, State, Local not specified - PPO | Admitting: Nurse Practitioner

## 2023-03-31 ENCOUNTER — Encounter: Payer: Self-pay | Admitting: Nurse Practitioner

## 2023-03-31 VITALS — BP 118/84 | HR 68 | Temp 97.6°F | Ht 71.0 in | Wt 201.4 lb

## 2023-03-31 DIAGNOSIS — Z8709 Personal history of other diseases of the respiratory system: Secondary | ICD-10-CM

## 2023-03-31 MED ORDER — ALBUTEROL SULFATE (2.5 MG/3ML) 0.083% IN NEBU
2.5000 mg | INHALATION_SOLUTION | Freq: Once | RESPIRATORY_TRACT | Status: AC
  Administered 2023-03-31: 2.5 mg via RESPIRATORY_TRACT

## 2023-03-31 NOTE — Progress Notes (Signed)
   Established Patient Office Visit  Subjective   Patient ID: Daniel Mccarthy, male    DOB: 10-Oct-1995  Age: 27 y.o. 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  History of Present Illness   The patient, a young parent, is planning to enlist in the [employer] and requires a pulmonary function test (PFT) for clearance. He denies any history of respiratory issues, including asthma. He recalls a previous prescription for an albuterol inhaler, which was given during a bout of cold, not for asthma. He denies any current respiratory symptoms, including shortness of breath, wheezing, or coughing.        ROS See pertinent positives and negatives per HPI.    Objective:     BP 118/84 (BP Location: Left Arm)   Pulse 68   Temp 97.6 F (36.4 C)   Ht 5\' 11"  (1.803 m)   Wt 201 lb 6.4 oz (91.4 kg)   SpO2 97%   BMI 28.09 kg/m    Physical Exam Vitals and nursing note reviewed.  Constitutional:      Appearance: Normal appearance.  HENT:     Head: Normocephalic.  Eyes:     Conjunctiva/sclera: Conjunctivae normal.  Cardiovascular:     Rate and Rhythm: Normal rate and regular rhythm.     Pulses: Normal pulses.     Heart sounds: Normal heart sounds.  Pulmonary:     Effort: Pulmonary effort is normal.     Breath sounds: Normal breath sounds.  Musculoskeletal:     Cervical back: Normal range of motion.  Skin:    General: Skin is warm.  Neurological:     General: No focal deficit present.     Mental Status: He is alert and oriented to person, place, and time.  Psychiatric:        Mood and Affect: Mood normal.        Behavior: Behavior normal.        Thought Content: Thought content normal.        Judgment: Judgment normal.      Assessment & Plan:    Problem List Items Addressed This Visit       Other   History of bronchitis - Primary   He has no current respiratory symptoms and his past medical history reveals a prescription for an albuterol inhaler for a cold, but no asthma diagnosis. Lung exam is normal. Spirometry in the office normal with pre and post albuterol. Print out given to patient. Encouraged him to call if he requires full PFTs.       Relevant Orders   Spirometry with graph    Return if symptoms worsen or fail to improve.    Gerre Scull, NP

## 2023-03-31 NOTE — Assessment & Plan Note (Signed)
He has no current respiratory symptoms and his past medical history reveals a prescription for an albuterol inhaler for a cold, but no asthma diagnosis. Lung exam is normal. Spirometry in the office normal with pre and post albuterol. Print out given to patient. Encouraged him to call if he requires full PFTs.

## 2023-03-31 NOTE — Patient Instructions (Signed)
It was great to see you!  Your spirometry was normal. You do not have asthma.   Let's follow-up with any concerns  Take care,  Rodman Pickle, NP
